# Patient Record
Sex: Female | Born: 1975 | Smoking: Never smoker
Health system: Southern US, Community
[De-identification: ages and names within clinical notes are randomized; demographics above are authoritative.]

## PROBLEM LIST (undated history)

## (undated) DIAGNOSIS — E079 Disorder of thyroid, unspecified: Secondary | ICD-10-CM

## (undated) HISTORY — DX: Disorder of thyroid, unspecified: E07.9

---

## 2009-02-17 ENCOUNTER — Encounter: Payer: Self-pay | Admitting: Maternal and Fetal Medicine

## 2009-03-09 ENCOUNTER — Encounter: Payer: Self-pay | Admitting: Maternal and Fetal Medicine

## 2010-05-06 IMAGING — US US OB DETAIL+14 WK - NRPT [facility]
1 series · 14 of 28 positions shown · non-contrast
Comparison: none

[Series 1: us ob detail+14 wk - nrpt [facility] · 14 of 68 slices shown]
[im 3/68]
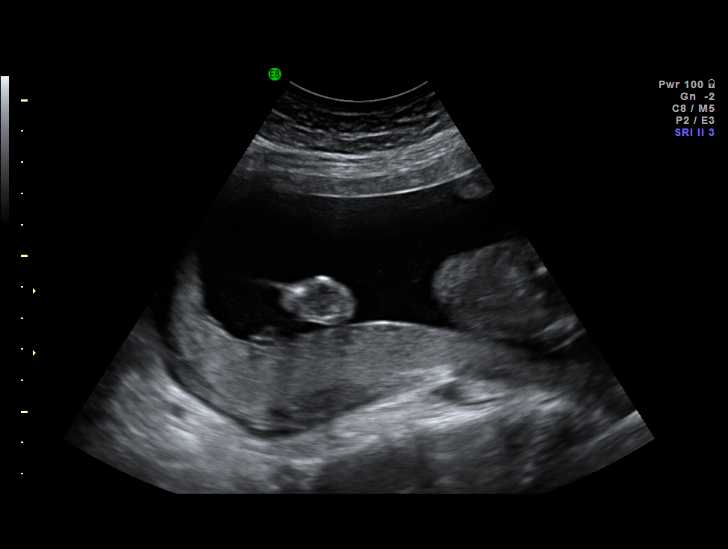
[im 8/68]
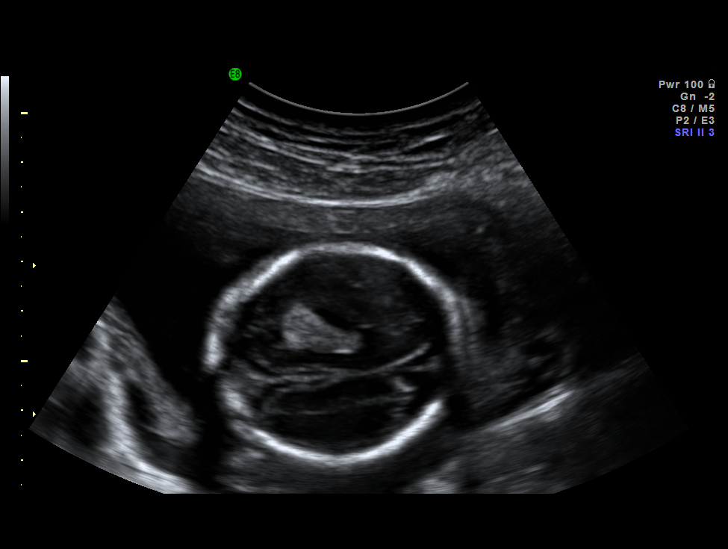
[im 13/68]
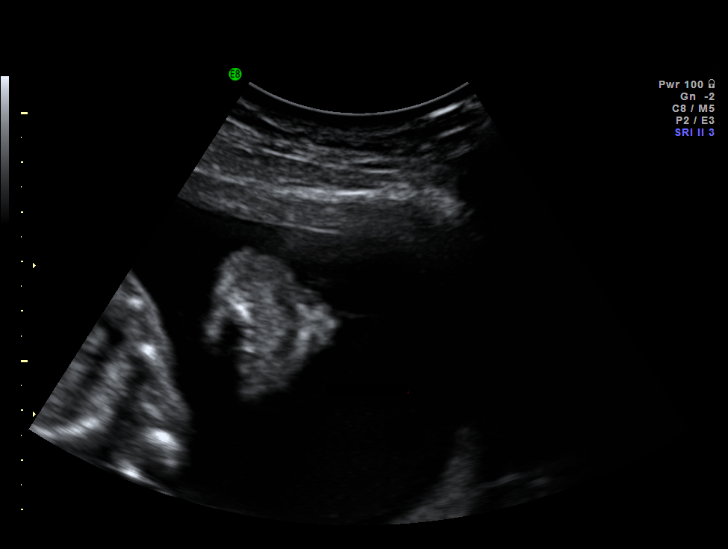
[im 18/68]
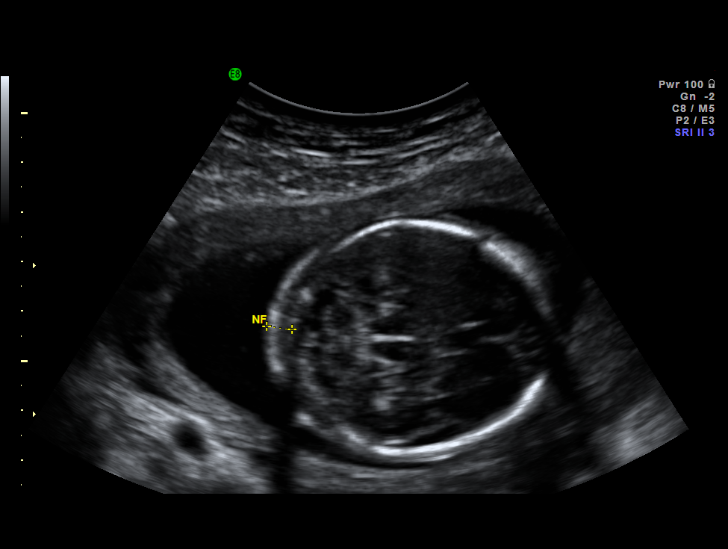
[im 23/68]
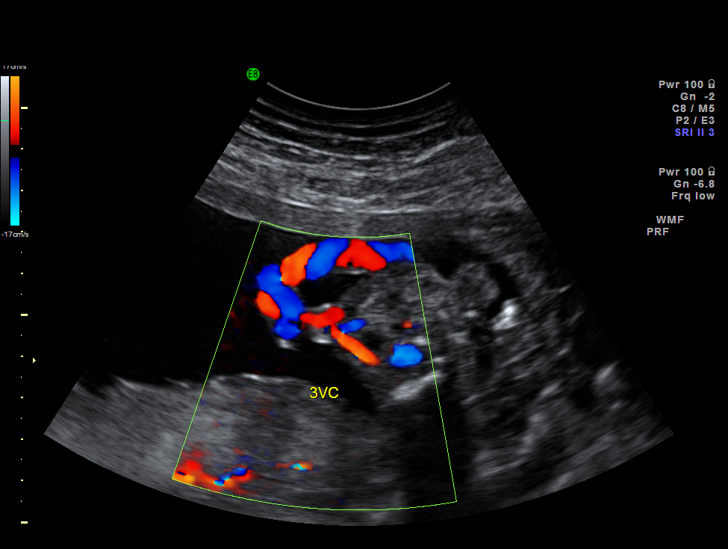
[im 28/68]
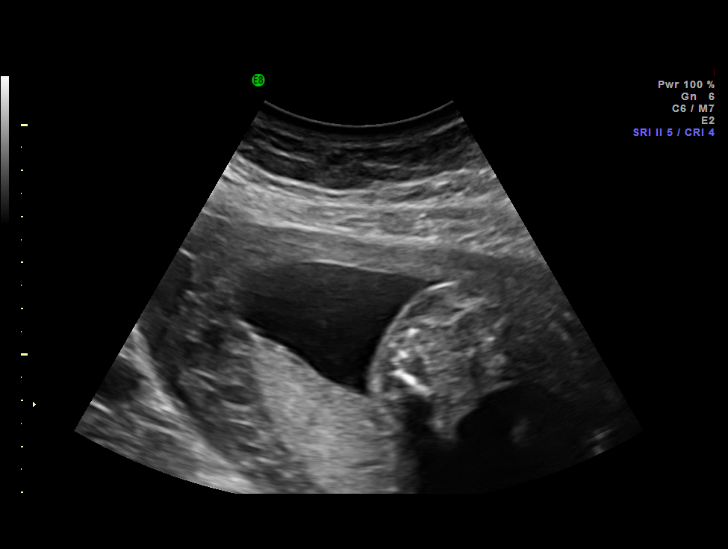
[im 33/68]
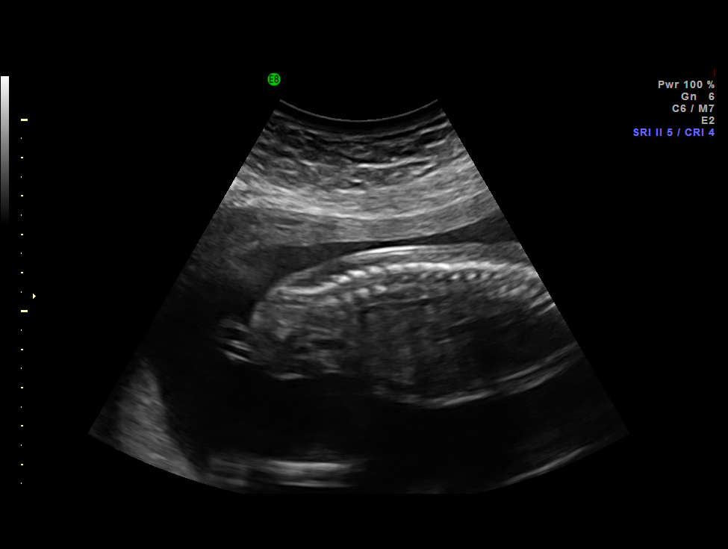
[im 38/68]
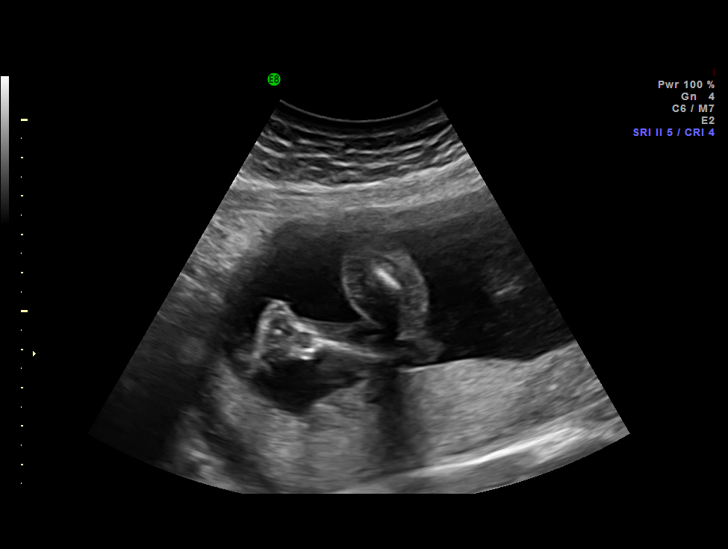
[im 43/68]
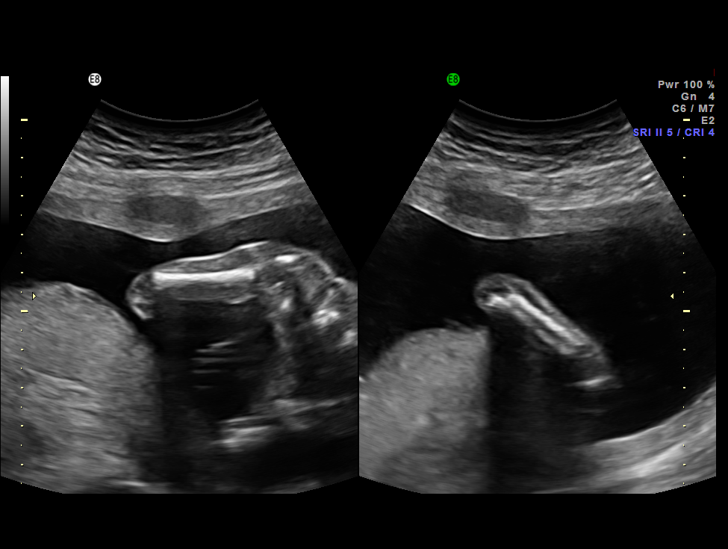
[im 48/68]
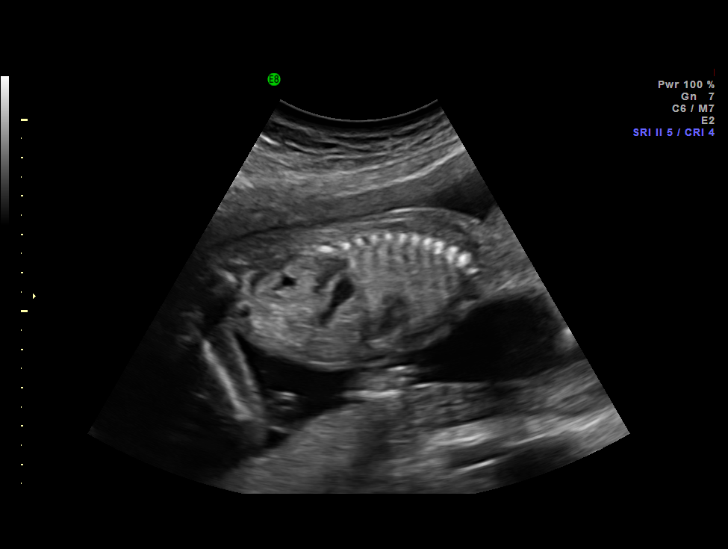
[im 53/68]
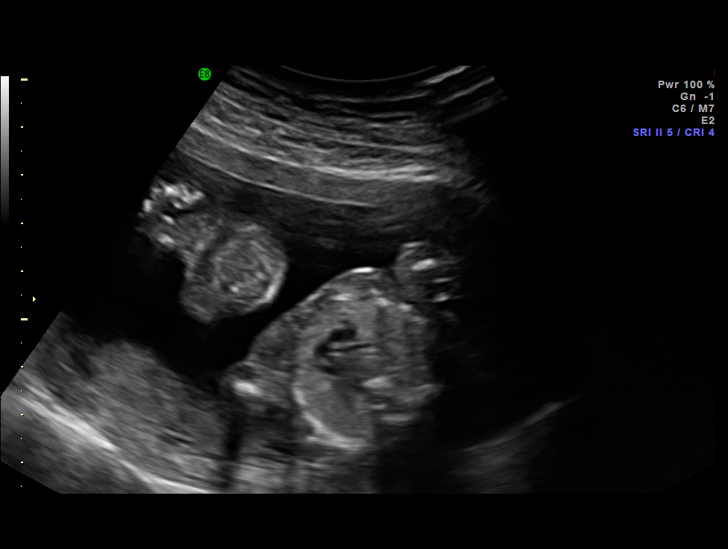
[im 58/68]
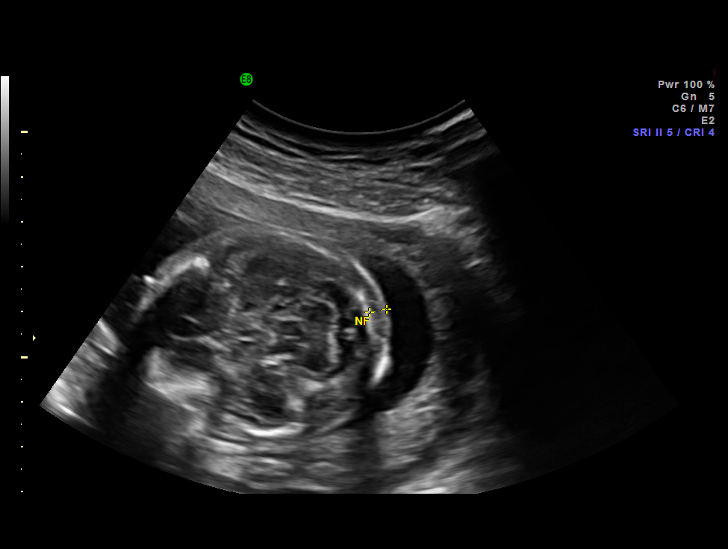
[im 63/68]
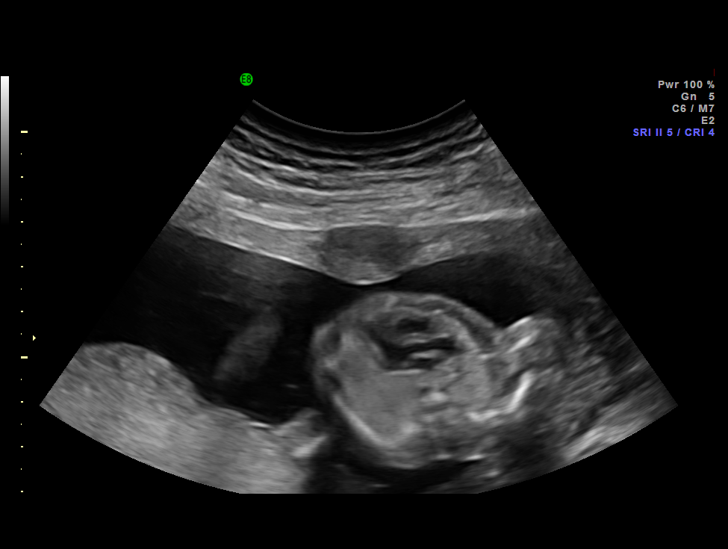
[im 68/68]
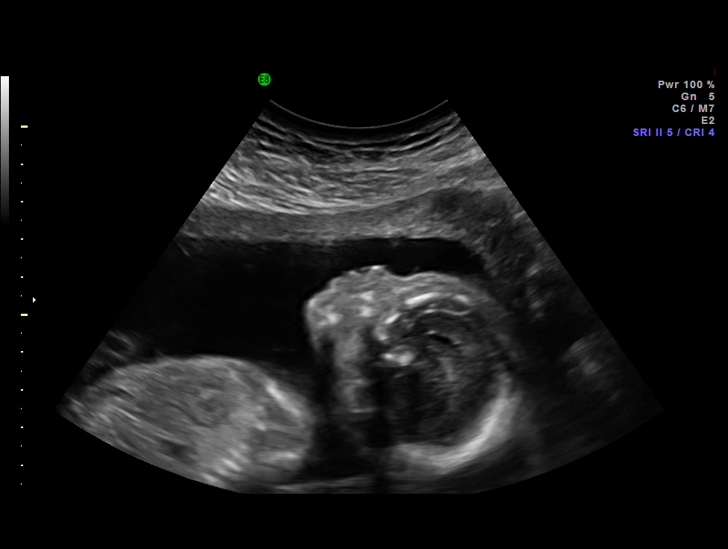

[14 of 28 positions shown; findings below may reference images not displayed]

IMAGES IMPORTED FROM THE [employer] WORKFLOW SYSTEM
NO DICTATION FOR STUDY

## 2020-09-22 LAB — TSH: TSH: 0.28 — AB (ref 0.41–5.90)

## 2020-10-11 ENCOUNTER — Encounter: Payer: Self-pay | Admitting: Nurse Practitioner

## 2020-10-11 ENCOUNTER — Other Ambulatory Visit: Payer: Self-pay

## 2020-10-11 ENCOUNTER — Ambulatory Visit: Payer: Managed Care, Other (non HMO) | Admitting: Nurse Practitioner

## 2020-10-11 VITALS — BP 125/80 | HR 76 | Ht 69.0 in | Wt 235.0 lb

## 2020-10-11 DIAGNOSIS — R946 Abnormal results of thyroid function studies: Secondary | ICD-10-CM | POA: Diagnosis not present

## 2020-10-11 NOTE — Patient Instructions (Signed)

## 2020-10-11 NOTE — Progress Notes (Signed)
10/11/2020     Endocrinology Consult Note    Subjective:    Patient ID: Mackenzie Cannon, female    DOB: [birth year], PCP Dhivianathan, Birdie Hopes, MD.   Past Medical History:  Diagnosis Date  . Thyroid disease     History reviewed. No pertinent surgical history.  Social History   Socioeconomic History  . Marital status: Unknown    Spouse name: Not on file  . Number of children: Not on file  . Years of education: Not on file  . Highest education level: Not on file  Occupational History  . Not on file  Tobacco Use  . Smoking status: Never Smoker  . Smokeless tobacco: Never Used  Substance and Sexual Activity  . Alcohol use: Not Currently  . Drug use: Not on file  . Sexual activity: Not on file  Other Topics Concern  . Not on file  Social History Narrative  . Not on file   Social Determinants of Health   Financial Resource Strain: Not on file  Food Insecurity: Not on file  Transportation Needs: Not on file  Physical Activity: Not on file  Stress: Not on file  Social Connections: Not on file    History reviewed. No pertinent family history.  No outpatient encounter medications on file as of 10/11/2020.   No facility-administered encounter medications on file as of 10/11/2020.    ALLERGIES: No Known Allergies  VACCINATION STATUS:  There is no immunization history on file for this patient.   HPI  Mackenzie Cannon is 45 y.o. female who presents today with a medical history as above. she is being seen in consultation for hyperthyroidism requested by Dhivianathan, Birdie Hopes, MD.  she has been dealing with symptoms of a swollen neck, cold intolerance, irritability, dry skin, and hair loss for many years. These symptoms are progressively worsening and troubling to her.  her most recent thyroid labs revealed suppressed TSH of 0.28 and normal Free T4 of 0.88 on 09/22/20.  She reports she had initial workup for thyroid dysfunction when she was in her 60's which  consisted of NM uptake and scan and thyroid ultrasound which did not warrant further treatment (no records available to review), only monitoring.  She denies dysphagia, choking, shortness of breath, no recent voice change.    she denies family history of thyroid dysfunction and family hx of thyroid cancer. she denies personal history of goiter. she is not on any anti-thyroid medications nor on any thyroid hormone supplements. Denies use of Biotin containing supplements.  she is willing to proceed with appropriate work up and therapy for thyrotoxicosis.   Review of systems  Constitutional: + Minimally fluctuating body weight, current Body mass index is 34.7 kg/m., + fatigue, no subjective hyperthermia, + subjective hypothermia Eyes: no blurry vision, no xerophthalmia ENT: no sore throat, no nodules palpated in throat, no dysphagia/odynophagia, no hoarseness Cardiovascular: no chest pain, no shortness of breath, no palpitations, no leg swelling Respiratory: no cough, no shortness of breath Gastrointestinal: no nausea/vomiting/diarrhea Musculoskeletal: no muscle/joint aches Skin: no rashes, no hyperemia, + ongoing hair loss and dry skin Neurological: no tremors, no numbness, no tingling, no dizziness Psychiatric: no depression, no anxiety, reports being easily irritated   Objective:    BP 125/80 (BP Location: Left Arm, Patient Position: Sitting)   Pulse 76   Ht 5\' 9"  (1.753 m)   Wt 235 lb (106.6 kg)   BMI 34.70 kg/m   Wt Readings from Last 3 Encounters:  10/11/20 235 lb (106.6 kg)     BP Readings from Last 3 Encounters:  10/11/20 125/80                        Physical Exam- Limited  Constitutional:  Body mass index is 34.7 kg/m. , not in acute distress, normal state of mind Eyes:  EOMI, no exophthalmos Neck: Supple Thyroid: No gross goiter Cardiovascular: RRR, no murmers, rubs, or gallops, no edema Respiratory: Adequate breathing efforts, no crackles, rales, rhonchi, or  wheezing Musculoskeletal: no gross deformities, strength intact in all four extremities, no gross restriction of joint movements Skin:  no rashes, no hyperemia Neurological: no tremor with outstretched hands   CMP  No results found for: NA, K, CL, CO2, GLUCOSE, BUN, CREATININE, CALCIUM, PROT, ALBUMIN, AST, ALT, ALKPHOS, BILITOT, GFRNONAA, GFRAA   CBC No results found for: WBC, RBC, HGB, HCT, PLT, MCV, MCH, MCHC, RDW, LYMPHSABS, MONOABS, EOSABS, BASOSABS   Diabetic Labs (most recent): No results found for: HGBA1C  Lipid Panel  No results found for: CHOL, TRIG, HDL, CHOLHDL, VLDL, LDLCALC, LDLDIRECT, LABVLDL   Lab Results  Component Value Date   TSH 0.28 (A) 09/22/2020        Assessment & Plan:   1. Abnormal results of thyroid function studies  she is being seen at a kind request of Dhivianathan, Birdie Hopes, MD.  her history and most recent labs are reviewed, and she was examined clinically. However, more information is needed to properly identify her thyroid dysfunction.  Her TSH was slightly suppressed and her Free T4 was low normal which is inconsistent with primary hyperthyroidism and she has symptoms of both overactive and underactive thyroid. She agrees to proceed with diagnostic workup and treatment plan.   I will repeat full profile thyroid function tests today including thyroid antibody testing to rule out autoimmune thyroid disorders.  Will hold off on additional imaging such as uptake and scan or thyroid ultrasound for now.  I did not initiate any new prescriptions at this appointment.  Her HR is controlled at 76 and she has mild symptoms.   -Patient is advised to maintain close follow up with Dhivianathan, Birdie Hopes, MD for primary care needs.   - Time spent with the patient: 60 minutes, of which >50% was spent in obtaining information about her symptoms, reviewing her previous labs, evaluations, and treatments, counseling her about her hyperthyroidism , and  developing a plan to confirm the diagnosis and long term treatment as necessary. Please refer to "Patient Self Inventory" in the Media tab for reviewed elements of pertinent patient history.  Sabino Gasser participated in the discussions, expressed understanding, and voiced agreement with the above plans.  All questions were answered to her satisfaction. she is encouraged to contact clinic should she have any questions or concerns prior to her return visit.   Follow up plan: Return in about 4 weeks (around 11/08/2020) for Thyroid follow up, Previsit labs.   Thank you for involving me in the care of this pleasant patient, and I will continue to update you with her progress.  Ronny Bacon, [facility] [address] [place], [place] [address] Phone: [phone] Fax: [phone]  10/11/2020, 2:35 PM

## 2020-11-05 LAB — THYROGLOBULIN ANTIBODY: Thyroglobulin Antibody: <1 IU/mL (ref 0.0–0.9)

## 2020-11-05 LAB — THYROID PEROXIDASE ANTIBODY: Thyroperoxidase Ab SerPl-aCnc: <8 IU/mL (ref 0–34)

## 2020-11-05 LAB — T4, FREE: Free T4: 1.27 ng/dL (ref 0.82–1.77)

## 2020-11-05 LAB — TSH: TSH: 0.328 u[IU]/mL — ABNORMAL LOW (ref 0.450–4.500)

## 2020-11-05 LAB — T3, FREE: T3, Free: 3.6 pg/mL (ref 2.0–4.4)

## 2020-11-08 ENCOUNTER — Encounter: Payer: Self-pay | Admitting: Nurse Practitioner

## 2020-11-08 ENCOUNTER — Ambulatory Visit: Payer: BC Managed Care – PPO | Admitting: Nurse Practitioner

## 2020-11-08 ENCOUNTER — Other Ambulatory Visit: Payer: Self-pay

## 2020-11-08 ENCOUNTER — Ambulatory Visit: Payer: Managed Care, Other (non HMO) | Admitting: Nurse Practitioner

## 2020-11-08 VITALS — BP 132/86 | HR 65 | Ht 69.0 in | Wt 236.0 lb

## 2020-11-08 DIAGNOSIS — R946 Abnormal results of thyroid function studies: Secondary | ICD-10-CM | POA: Diagnosis not present

## 2020-11-08 DIAGNOSIS — E0590 Thyrotoxicosis, unspecified without thyrotoxic crisis or storm: Secondary | ICD-10-CM

## 2020-11-08 NOTE — Progress Notes (Signed)
11/08/2020     Endocrinology Follow Up Note    Subjective:    Patient ID: Mackenzie Cannon, female    DOB: 07-29-1975, PCP Dhivianathan, Birdie Hopes, MD.   Past Medical History:  Diagnosis Date  . Thyroid disease     History reviewed. No pertinent surgical history.  Social History   Socioeconomic History  . Marital status: Unknown    Spouse name: Not on file  . Number of children: Not on file  . Years of education: Not on file  . Highest education level: Not on file  Occupational History  . Not on file  Tobacco Use  . Smoking status: Never Smoker  . Smokeless tobacco: Never Used  Substance and Sexual Activity  . Alcohol use: Not Currently  . Drug use: Not on file  . Sexual activity: Not on file  Other Topics Concern  . Not on file  Social History Narrative  . Not on file   Social Determinants of Health   Financial Resource Strain: Not on file  Food Insecurity: Not on file  Transportation Needs: Not on file  Physical Activity: Not on file  Stress: Not on file  Social Connections: Not on file    History reviewed. No pertinent family history.  No outpatient encounter medications on file as of 11/08/2020.   No facility-administered encounter medications on file as of 11/08/2020.    ALLERGIES: No Known Allergies  VACCINATION STATUS:  There is no immunization history on file for this patient.   HPI  Mackenzie Cannon is 45 y.o. female who presents today with a medical history as above. she is being seen in follow up after being seen in consultation for hyperthyroidism requested by Dhivianathan, Birdie Hopes, MD.  she has been dealing with symptoms of a swollen neck, cold intolerance, irritability, dry skin, and hair loss for many years. These symptoms are progressively worsening and troubling to her.  her most recent thyroid labs revealed suppressed TSH of 0.28 and normal Free T4 of 0.88 on 09/22/20.  She reports she had initial workup for thyroid dysfunction  when she was in her 60's which consisted of NM uptake and scan and thyroid ultrasound which did not warrant further treatment (no records available to review), only monitoring.  She denies dysphagia, choking, shortness of breath, no recent voice change.    she denies family history of thyroid dysfunction and family hx of thyroid cancer. she denies personal history of goiter. she is not on any anti-thyroid medications nor on any thyroid hormone supplements. Denies use of Biotin containing supplements.  she is willing to proceed with appropriate work up and therapy for thyrotoxicosis.   Review of systems  Constitutional: + Minimally fluctuating body weight, current Body mass index is 34.85 kg/m., + fatigue, no subjective hyperthermia, + subjective hypothermia Eyes: no blurry vision, no xerophthalmia ENT: no sore throat, no nodules palpated in throat, no dysphagia/odynophagia, no hoarseness Cardiovascular: no chest pain, no shortness of breath, no palpitations, no leg swelling Respiratory: no cough, no shortness of breath Gastrointestinal: no nausea/vomiting/diarrhea Musculoskeletal: no muscle/joint aches Skin: no rashes, no hyperemia, + ongoing hair loss and dry skin Neurological: no tremors, no numbness, no tingling, no dizziness Psychiatric: no depression, no anxiety, reports being easily irritated   Objective:    BP 132/86   Pulse 65   Ht 5\' 9"  (1.753 m)   Wt 236 lb (107 kg)   BMI 34.85 kg/m   Wt Readings from Last 3 Encounters:  11/08/20 236 lb (107 kg)  10/11/20 235 lb (106.6 kg)     BP Readings from Last 3 Encounters:  11/08/20 132/86  10/11/20 125/80              Physical Exam- Limited  Constitutional:  Body mass index is 34.85 kg/m. , not in acute distress, normal state of mind Eyes:  EOMI, no exophthalmos Neck: Supple Cardiovascular: RRR, no murmers, rubs, or gallops, no edema Respiratory: Adequate breathing efforts, no crackles, rales, rhonchi, or  wheezing Musculoskeletal: no gross deformities, strength intact in all four extremities, no gross restriction of joint movements Skin:  no rashes, no hyperemia Neurological: no tremor with outstretched hands   CMP  No results found for: NA, K, CL, CO2, GLUCOSE, BUN, CREATININE, CALCIUM, PROT, ALBUMIN, AST, ALT, ALKPHOS, BILITOT, GFRNONAA, GFRAA   CBC No results found for: WBC, RBC, HGB, HCT, PLT, MCV, MCH, MCHC, RDW, LYMPHSABS, MONOABS, EOSABS, BASOSABS   Diabetic Labs (most recent): No results found for: HGBA1C  Lipid Panel  No results found for: CHOL, TRIG, HDL, CHOLHDL, VLDL, LDLCALC, LDLDIRECT, LABVLDL   Lab Results  Component Value Date   TSH 0.328 (L) 11/03/2020   TSH 0.28 (A) 09/22/2020   FREET4 1.27 11/03/2020     Results for TELA, KOTECKI (MRN [id number]) as of 11/08/2020 16:05  Ref. Range 09/22/2020 00:00 11/03/2020 16:14  TSH Latest Ref Range: 0.450 - 4.500 uIU/mL 0.28 (A) 0.328 (L)  Triiodothyronine,Free,Serum Latest Ref Range: 2.0 - 4.4 pg/mL  3.6  T4,Free(Direct) Latest Ref Range: 0.82 - 1.77 ng/dL  7.06  Thyroperoxidase Ab SerPl-aCnc Latest Ref Range: 0 - 34 IU/mL  <8  Thyroglobulin Antibody Latest Ref Range: 0.0 - 0.9 IU/mL  <1.0     Assessment & Plan:   1. Subclinical Hyperthyroidism:  she is being seen at a kind request of Dhivianathan, Birdie Hopes, MD.  her history and most recent labs are reviewed, and she was examined clinically. Her symptoms are stable.  -Her repeat labs show slight improvement in her TSH levels and normal Free T4 and Free T3 levels.  Her antibody testing was negative, ruling out autoimmune thyroid dysfunction as a cause.  -Will plan to repeat her thyroid function tests in 2 months for observation and will schedule her for thyroid ultrasound to assess for distinct nodules that may be contributing to her symptoms.    I did not initiate any new prescriptions at this appointment.  Her HR is controlled at 65 and she has mild, stable  symptoms.   -Patient is advised to maintain close follow up with Dhivianathan, Birdie Hopes, MD for primary care needs.    I spent 20 minutes in the care of the patient today including review of labs from Thyroid Function, CMP, and other relevant labs ; imaging/biopsy records (current and previous including abstractions from other facilities); face-to-face time discussing  her lab results and symptoms, medications doses, her options of short and long term treatment based on the latest standards of care / guidelines;   and documenting the encounter.  Sabino Gasser  participated in the discussions, expressed understanding, and voiced agreement with the above plans.  All questions were answered to her satisfaction. she is encouraged to contact clinic should she have any questions or concerns prior to her return visit.  Follow up plan: Return in about 2 months (around 01/08/2021) for Thyroid follow up, Previsit labs, thyroid ultrasound.   Thank you for involving me in the care of this pleasant patient, and I  will continue to update you with her progress.  Ronny Bacon, [facility] [address] [place], [place] [address] Phone: [phone] Fax: [phone]  11/08/2020, 4:15 PM

## 2020-11-14 ENCOUNTER — Ambulatory Visit (HOSPITAL_COMMUNITY)
Admission: RE | Admit: 2020-11-14 | Discharge: 2020-11-14 | Disposition: A | Discharge status: Home / Self Care | Payer: BC Managed Care – PPO | Source: Ambulatory Visit | Attending: Nurse Practitioner | Admitting: Nurse Practitioner

## 2020-11-14 ENCOUNTER — Other Ambulatory Visit: Payer: Self-pay

## 2020-11-14 DIAGNOSIS — R946 Abnormal results of thyroid function studies: Secondary | ICD-10-CM | POA: Insufficient documentation

## 2020-11-14 DIAGNOSIS — E0590 Thyrotoxicosis, unspecified without thyrotoxic crisis or storm: Secondary | ICD-10-CM | POA: Diagnosis present

## 2020-11-17 ENCOUNTER — Ambulatory Visit (HOSPITAL_COMMUNITY): Payer: Self-pay

## 2021-01-10 ENCOUNTER — Ambulatory Visit: Payer: BC Managed Care – PPO | Admitting: Nurse Practitioner

## 2021-01-12 ENCOUNTER — Other Ambulatory Visit: Payer: Self-pay | Admitting: Nurse Practitioner

## 2021-01-12 DIAGNOSIS — E0590 Thyrotoxicosis, unspecified without thyrotoxic crisis or storm: Secondary | ICD-10-CM

## 2021-01-24 LAB — TSH: TSH: 0.394 u[IU]/mL — ABNORMAL LOW (ref 0.450–4.500)

## 2021-01-24 LAB — T4, FREE: Free T4: 1.19 ng/dL (ref 0.82–1.77)

## 2021-01-24 LAB — T3, FREE: T3, Free: 2.9 pg/mL (ref 2.0–4.4)

## 2021-01-26 ENCOUNTER — Ambulatory Visit: Payer: BC Managed Care – PPO | Admitting: Nurse Practitioner

## 2021-01-26 DIAGNOSIS — E0590 Thyrotoxicosis, unspecified without thyrotoxic crisis or storm: Secondary | ICD-10-CM

## 2022-01-31 IMAGING — US US THYROID
1 series · 13 of 25 positions shown · non-contrast
Comparison: None.

CLINICAL DATA: Abnormal TSH

EXAM:
THYROID ULTRASOUND
TECHNIQUE: Ultrasound examination of the thyroid gland and adjacent soft
tissues was performed.

[Series 1: us thyroid · 13 of 68 slices shown]
[im 1/68]
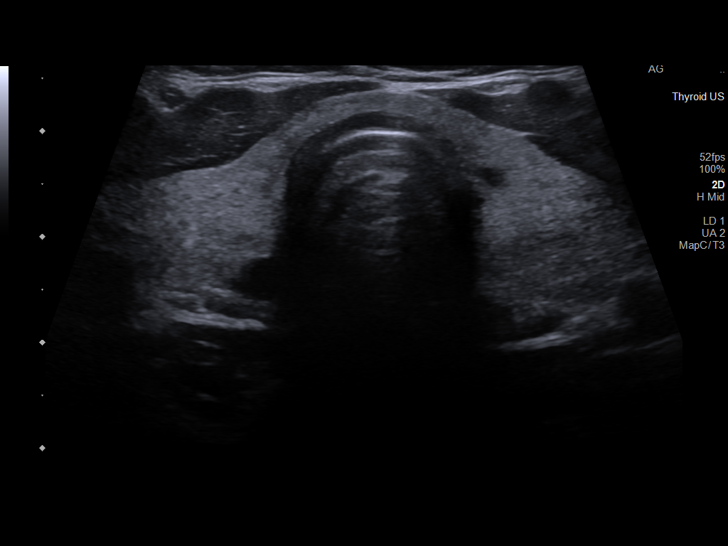
[im 6/68]
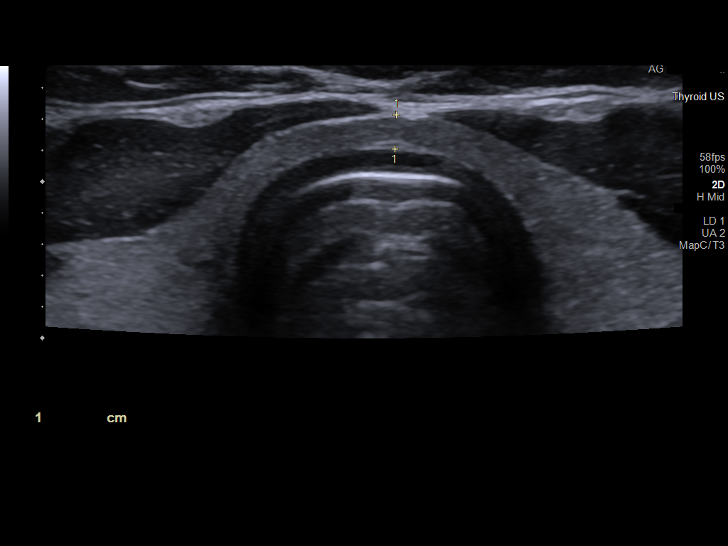
[im 12/68]
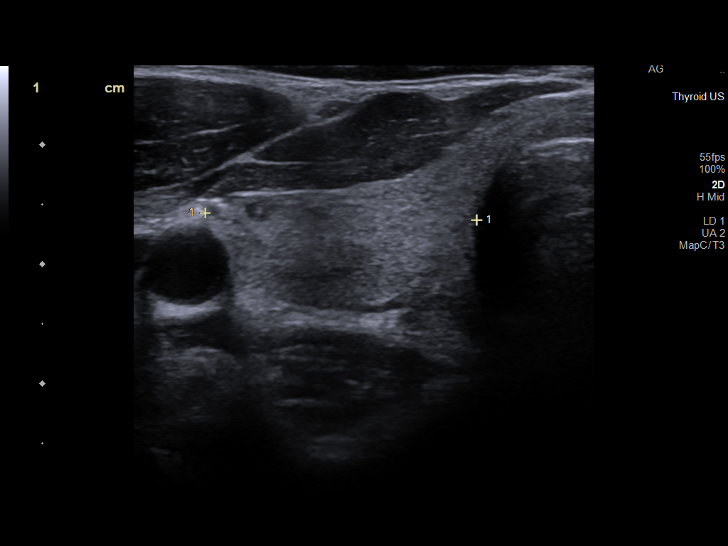
[im 17/68]
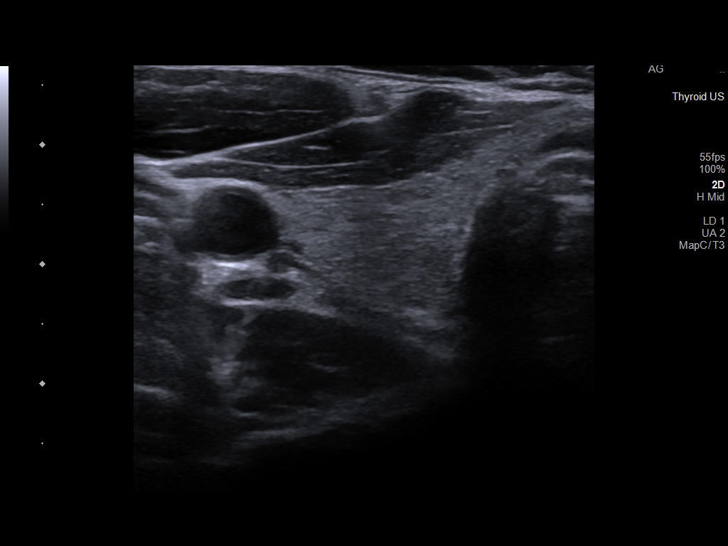
[im 23/68]
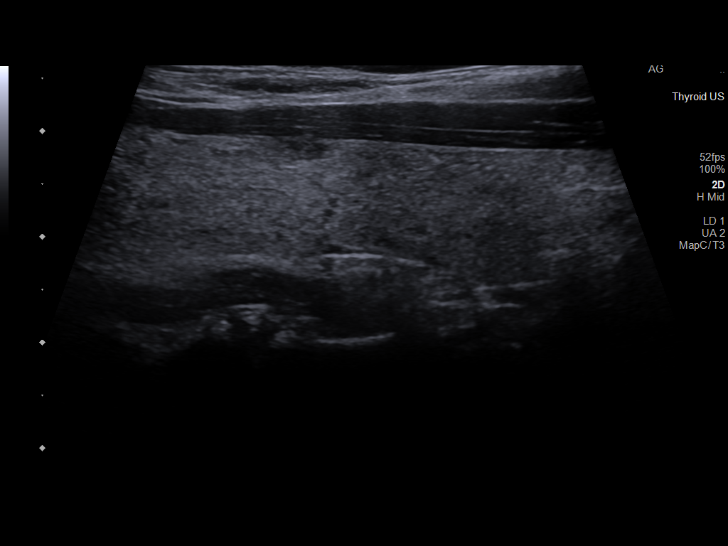
[im 28/68]
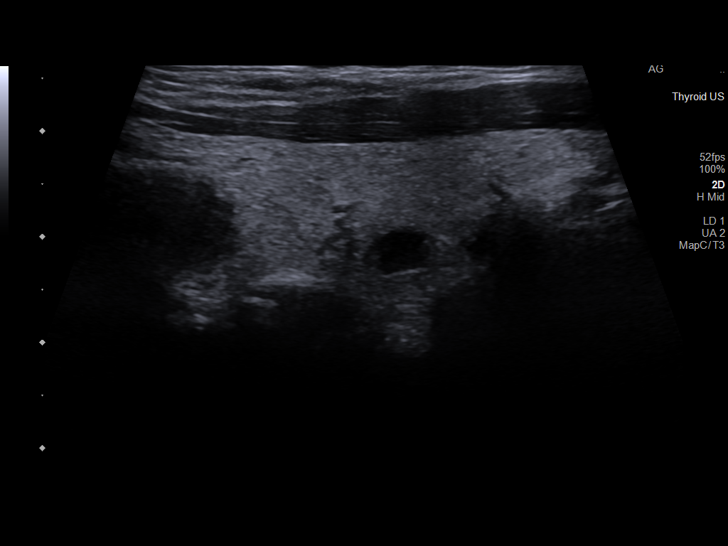
[im 34/68]
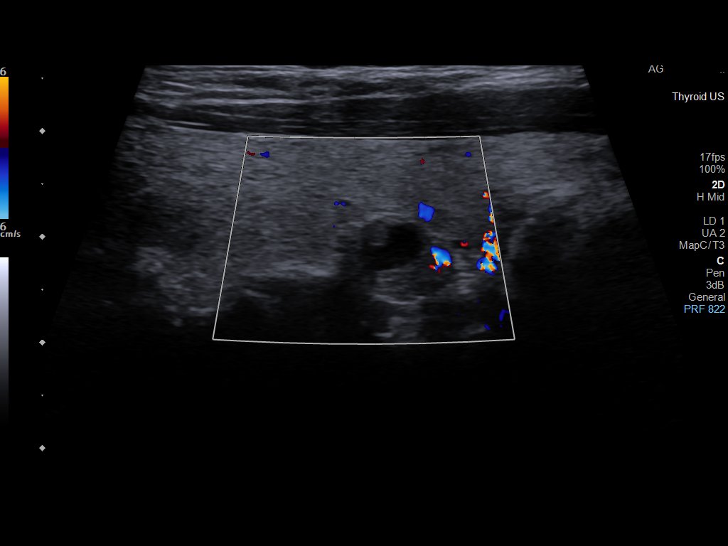
[im 40/68]
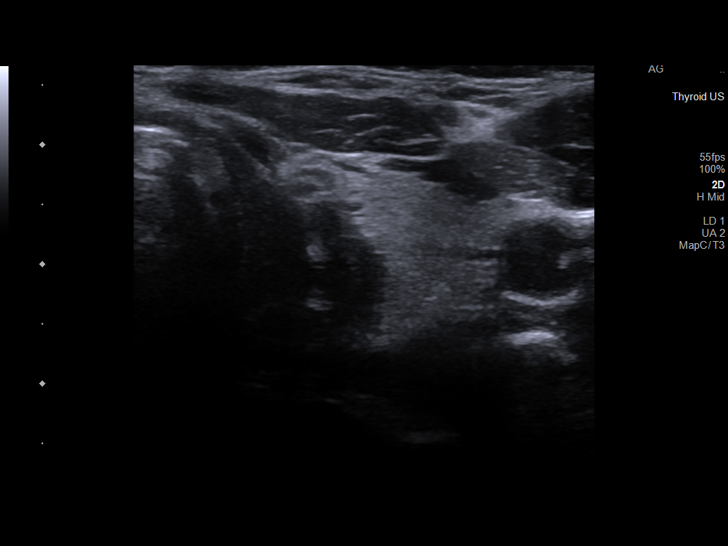
[im 45/68]
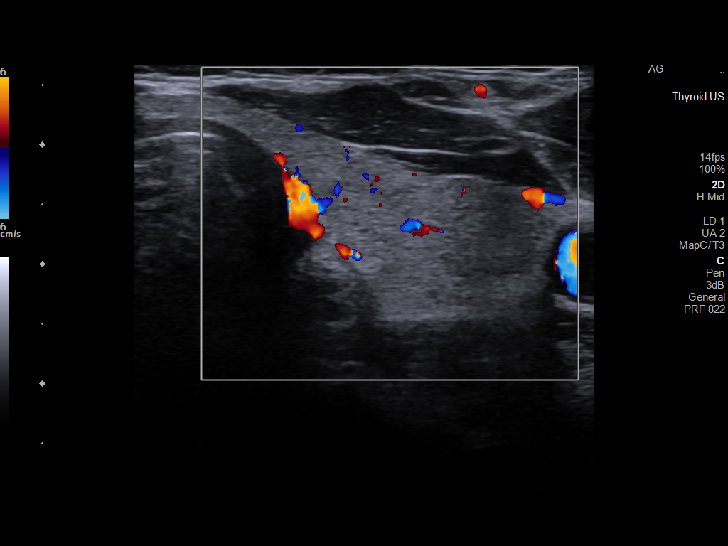
[im 51/68]
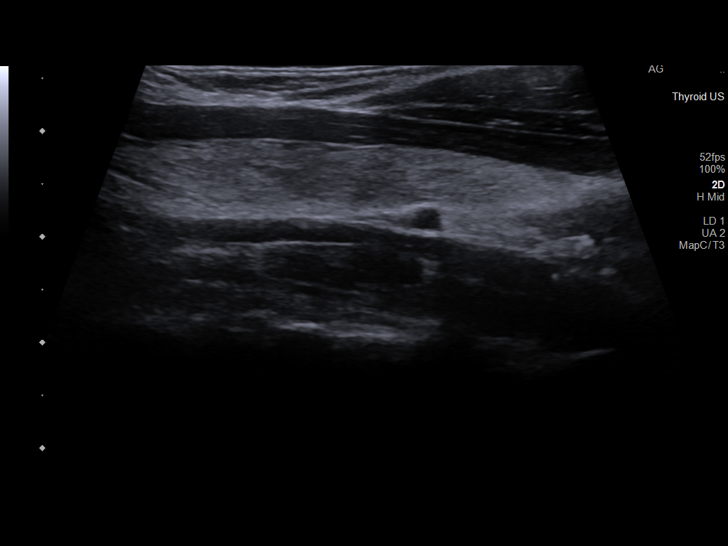
[im 56/68]
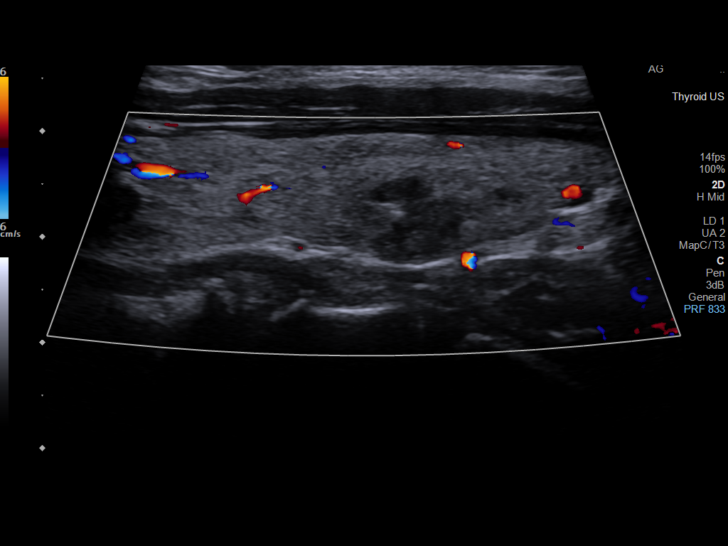
[im 62/68]
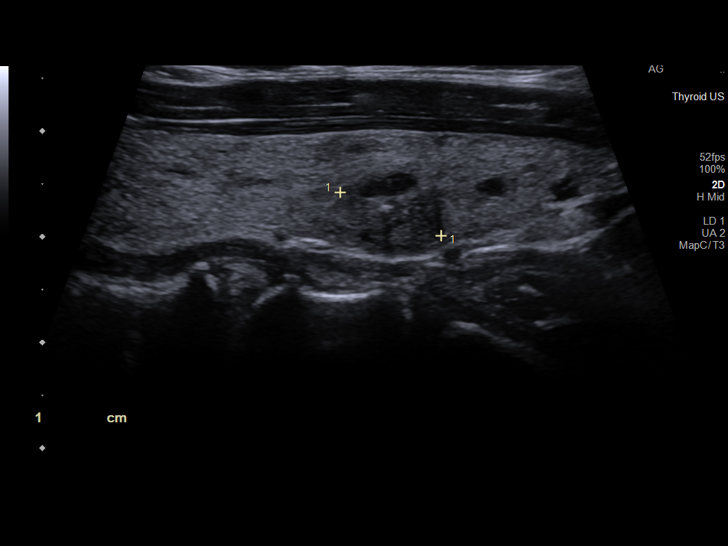
[im 68/68]
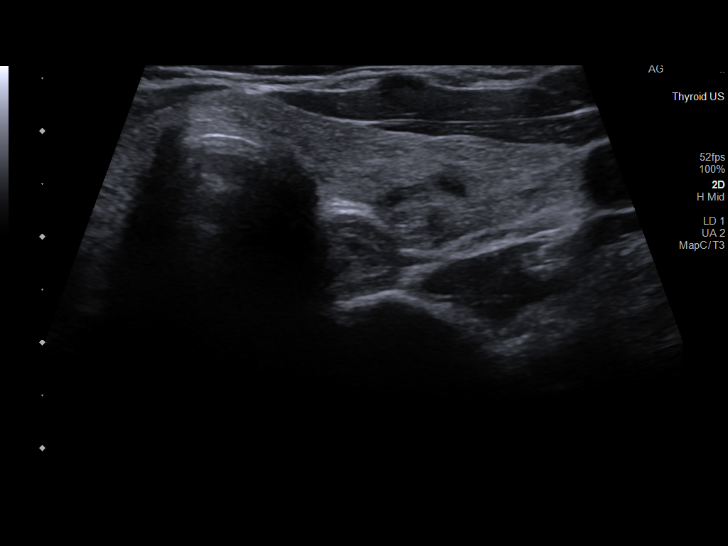

[13 of 25 positions shown; findings below may reference images not displayed]

FINDINGS: Parenchymal Echotexture: Mildly heterogenous

Isthmus: 2 mm

Right lobe: 4.9 x 1.3 x 2.3 cm

Left lobe: 4.7 x 1.2 x 2.2 cm

_________________________________________________________

Estimated total number of nodules >/= 1 cm: 1

Number of spongiform nodules >/=  2 cm not described below (TR1): 0

Number of mixed cystic and solid nodules >/= 1.5 cm not described
below (TR2): 0

_________________________________________________________

Nodule # 1:

Location: Right; Mid

Maximum size: 1.2 cm; Other 2 dimensions: 0.8 x 0.7 cm

Composition: mixed cystic and solid (1)

Echogenicity: hypoechoic (2)

Shape: not taller-than-wide (0)

Margins: ill-defined (0)

Echogenic foci: none (0)

ACR TI-RADS total points: 3.

ACR TI-RADS risk category: TR3 (3 points).

ACR TI-RADS recommendations:

Given size (<1.4 cm) and appearance, this nodule does NOT meet
TI-RADS criteria for biopsy or dedicated follow-up.

_________________________________________________________

Nodule # 2:

Location: Left; Mid

Maximum size: 1.0 cm; Other 2 dimensions: 1.0 x 0.7 cm

Composition: mixed cystic and solid (1)

Echogenicity: hypoechoic (2)

Shape: not taller-than-wide (0)

Margins: ill-defined (0)

Echogenic foci: macrocalcifications (1)

ACR TI-RADS total points: 4.

ACR TI-RADS risk category: TR4 (4-6 points).

ACR TI-RADS recommendations:

*Given size (>/= 1 - 1.4 cm) and appearance, a follow-up ultrasound
in 1 year should be considered based on TI-RADS criteria.

_________________________________________________________
IMPRESSION: 1.2 cm right mid thyroid TR 3 nodule does not meet criteria for
biopsy or follow-up.

1 cm left mid thyroid TR 4 nodule meets criteria for follow-up in 1
year.

The above is in keeping with the ACR TI-RADS recommendations - [HOSPITAL] 9058;[DATE].
